# Patient Record
Sex: Male | Born: 1975 | Race: Black or African American | Hispanic: No | Marital: Single | Smoking: Current every day smoker
Health system: Southern US, Community
[De-identification: ages and names within clinical notes are randomized; demographics above are authoritative.]

---

## 2004-05-27 ENCOUNTER — Emergency Department: Payer: Self-pay | Admitting: Emergency Medicine

## 2004-05-28 ENCOUNTER — Other Ambulatory Visit: Payer: Self-pay

## 2012-12-27 ENCOUNTER — Emergency Department (HOSPITAL_COMMUNITY)
Admission: EM | Admit: 2012-12-27 | Discharge: 2012-12-27 | Disposition: A | Discharge status: Home / Self Care | Payer: Self-pay | Attending: Emergency Medicine | Admitting: Emergency Medicine

## 2012-12-27 ENCOUNTER — Encounter (HOSPITAL_COMMUNITY): Payer: Self-pay

## 2012-12-27 DIAGNOSIS — R300 Dysuria: Secondary | ICD-10-CM | POA: Insufficient documentation

## 2012-12-27 DIAGNOSIS — F17200 Nicotine dependence, unspecified, uncomplicated: Secondary | ICD-10-CM | POA: Insufficient documentation

## 2012-12-27 DIAGNOSIS — R109 Unspecified abdominal pain: Secondary | ICD-10-CM | POA: Insufficient documentation

## 2012-12-27 DIAGNOSIS — N342 Other urethritis: Secondary | ICD-10-CM | POA: Insufficient documentation

## 2012-12-27 MED ORDER — ONDANSETRON 8 MG PO TBDP
8.0000 mg | ORAL_TABLET | Freq: Once | ORAL | Status: AC
  Administered 2012-12-27: 8 mg via ORAL
  Filled 2012-12-27: qty 1

## 2012-12-27 MED ORDER — LIDOCAINE HCL (PF) 1 % IJ SOLN
INTRAMUSCULAR | Status: AC
  Administered 2012-12-27: 1 mL
  Filled 2012-12-27: qty 5

## 2012-12-27 MED ORDER — AZITHROMYCIN 250 MG PO TABS
1000.0000 mg | ORAL_TABLET | Freq: Every day | ORAL | Status: DC
  Administered 2012-12-27: 1000 mg via ORAL
  Filled 2012-12-27: qty 4

## 2012-12-27 MED ORDER — CEFTRIAXONE SODIUM 250 MG IJ SOLR
250.0000 mg | Freq: Once | INTRAMUSCULAR | Status: AC
  Administered 2012-12-27: 250 mg via INTRAMUSCULAR
  Filled 2012-12-27: qty 250

## 2012-12-27 NOTE — ED Notes (Signed)
Pt reprots yellow penile discharge since Wednesday had unprotected sex on on Monday, denies any other urinary s/s,no fever, no n/v/

## 2012-12-27 NOTE — ED Provider Notes (Signed)
History  This chart was scribed for William Gaskins, MD by Shari Heritage, ED Scribe. The patient was seen in room APA08/APA08. Patient's care was started at 0920.   CSN: [id number]  Arrival date & time 12/27/12  0907   First MD Initiated Contact with Patient 12/27/12 0920      Chief Complaint  Patient presents with  . Penile Discharge    The history is provided by the patient. No language interpreter was used.    HPI Comments: Bronislaw Switzer is a 37 y.o. male who presents to the Emergency Department complaining of persistent, yellow penile discharge onset 3 days ago. There is associated mild dysuria and mild lower abdominal pain. Patient denies fever, chills, nausea or vomiting. He admits to having had unprotected sex 5 days ago. Patient has no chronic medical conditions and no pertinent past surgical history. He is a current every day smoker.   PMH - none  History reviewed. No pertinent past surgical history.  No family history on file.  History  Substance Use Topics  . Smoking status: Current Every Day Smoker  . Smokeless tobacco: Not on file  . Alcohol Use: No      Review of Systems  Constitutional: Negative for fever and chills.  Gastrointestinal: Positive for abdominal pain. Negative for nausea and vomiting.  Genitourinary: Positive for dysuria and discharge.    Allergies  Review of patient's allergies indicates no known allergies.  Home Medications  No current outpatient prescriptions on file.  Triage Vitals: BP 178/94  Pulse 63  Temp(Src) 98.5 F (36.9 C) (Oral)  Resp 20  Ht 5\' 10"  (1.778 m)  Wt 230 lb (104.327 kg)  BMI 33 kg/m2  SpO2 100%  Physical Exam CONSTITUTIONAL: Well developed/well nourished HEAD: Normocephalic/atraumatic EYES: EOMI ENMT: Mucous membranes moist NECK: supple no meningeal signs CV: S1/S2 noted, no murmurs/rubs/gallops noted LUNGS: Lungs are clear to auscultation bilaterally, no apparent distress ABDOMEN: soft, nontender,  no rebound or guarding GU:no cva tenderness, penile discharge noted, no penile lesions, no testicular tenderness (chaperone present) NEURO: Pt is awake/alert, moves all extremitiesx4 EXTREMITIES: pulses normal, full ROM SKIN: warm, color normal PSYCH: no abnormalities of mood noted  ED Course  Procedures (including critical care time) DIAGNOSTIC STUDIES: Oxygen Saturation is 100% on room air, normal by my interpretation.    COORDINATION OF CARE: 9:41 AM- Patient informed of current plan for treatment and evaluation and agrees with plan at this time.   Advised of safe sex practices Advised to get BP check soon to ensure no diagnosis of HTN Empirically tx for GC/Chlam   Labs Reviewed  GC/CHLAMYDIA PROBE AMP    1. Urethritis       MDM  Nursing notes including past medical history and social history reviewed and considered in documentation       I personally performed the services described in this documentation, which was scribed in my presence. The recorded information has been reviewed and is accurate.      William Gaskins, MD 12/27/12 1000

## 2012-12-31 NOTE — ED Notes (Signed)
+  Gonorrhea Patient treated with Rocephin-[facility] letter faxed

## 2013-01-01 ENCOUNTER — Telehealth (HOSPITAL_COMMUNITY): Payer: Self-pay | Admitting: Emergency Medicine

## 2013-01-02 ENCOUNTER — Telehealth (HOSPITAL_COMMUNITY): Payer: Self-pay | Admitting: Emergency Medicine

## 2015-03-05 ENCOUNTER — Encounter (HOSPITAL_COMMUNITY): Payer: Self-pay | Admitting: Emergency Medicine

## 2015-03-05 ENCOUNTER — Emergency Department (HOSPITAL_COMMUNITY)
Admission: EM | Admit: 2015-03-05 | Discharge: 2015-03-05 | Discharge status: Against Medical Advice | Payer: Self-pay | Attending: Emergency Medicine | Admitting: Emergency Medicine

## 2015-03-05 DIAGNOSIS — F1010 Alcohol abuse, uncomplicated: Secondary | ICD-10-CM | POA: Insufficient documentation

## 2015-03-05 DIAGNOSIS — F1410 Cocaine abuse, uncomplicated: Secondary | ICD-10-CM | POA: Insufficient documentation

## 2015-03-05 DIAGNOSIS — F1910 Other psychoactive substance abuse, uncomplicated: Secondary | ICD-10-CM

## 2015-03-05 DIAGNOSIS — Z720 Tobacco use: Secondary | ICD-10-CM | POA: Insufficient documentation

## 2015-03-05 NOTE — ED Provider Notes (Signed)
Pt not in room when I went to see him.  Pt left ama  Bethann Berkshire, MD 03/05/15 1134

## 2015-03-05 NOTE — ED Notes (Signed)
Patient came in with initial c/o nausea and vomiting. Per patient in triage really here in ER today for help with alcohol and cocaine. Per patient "partied last night" with use of cocaine. Denies any other illegal substances. Patient denies any chest pain or shortness of breath. Denies any SI/HI.

## 2015-09-18 ENCOUNTER — Emergency Department (HOSPITAL_COMMUNITY)
Admission: EM | Admit: 2015-09-18 | Discharge: 2015-09-18 | Disposition: A | Discharge status: Home / Self Care | Payer: No Typology Code available for payment source | Attending: Emergency Medicine | Admitting: Emergency Medicine

## 2015-09-18 ENCOUNTER — Emergency Department (HOSPITAL_COMMUNITY): Payer: No Typology Code available for payment source

## 2015-09-18 ENCOUNTER — Encounter (HOSPITAL_COMMUNITY): Payer: Self-pay | Admitting: *Deleted

## 2015-09-18 DIAGNOSIS — Y998 Other external cause status: Secondary | ICD-10-CM | POA: Diagnosis not present

## 2015-09-18 DIAGNOSIS — F17210 Nicotine dependence, cigarettes, uncomplicated: Secondary | ICD-10-CM | POA: Insufficient documentation

## 2015-09-18 DIAGNOSIS — Y92410 Unspecified street and highway as the place of occurrence of the external cause: Secondary | ICD-10-CM | POA: Insufficient documentation

## 2015-09-18 DIAGNOSIS — S3992XA Unspecified injury of lower back, initial encounter: Secondary | ICD-10-CM | POA: Diagnosis not present

## 2015-09-18 DIAGNOSIS — S0990XA Unspecified injury of head, initial encounter: Secondary | ICD-10-CM | POA: Diagnosis present

## 2015-09-18 DIAGNOSIS — F1010 Alcohol abuse, uncomplicated: Secondary | ICD-10-CM | POA: Diagnosis not present

## 2015-09-18 DIAGNOSIS — S301XXA Contusion of abdominal wall, initial encounter: Secondary | ICD-10-CM | POA: Insufficient documentation

## 2015-09-18 DIAGNOSIS — S060X9A Concussion with loss of consciousness of unspecified duration, initial encounter: Secondary | ICD-10-CM | POA: Diagnosis not present

## 2015-09-18 DIAGNOSIS — R030 Elevated blood-pressure reading, without diagnosis of hypertension: Secondary | ICD-10-CM | POA: Insufficient documentation

## 2015-09-18 DIAGNOSIS — Y9389 Activity, other specified: Secondary | ICD-10-CM | POA: Insufficient documentation

## 2015-09-18 MED ORDER — ACETAMINOPHEN 325 MG PO TABS
650.0000 mg | ORAL_TABLET | Freq: Once | ORAL | Status: AC
  Administered 2015-09-18: 650 mg via ORAL
  Filled 2015-09-18: qty 2

## 2015-09-18 MED ORDER — ACETAMINOPHEN 325 MG PO TABS: 650.0000 mg | ORAL_TABLET | Freq: Once | ORAL | Status: DC
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
## 2018-05-07 ENCOUNTER — Emergency Department (HOSPITAL_COMMUNITY)
Admission: EM | Admit: 2018-05-07 | Discharge: 2018-05-07 | Disposition: A | Discharge status: Home / Self Care | Payer: Self-pay | Attending: Emergency Medicine | Admitting: Emergency Medicine

## 2018-05-07 ENCOUNTER — Encounter (HOSPITAL_COMMUNITY): Payer: Self-pay | Admitting: Emergency Medicine

## 2018-05-07 ENCOUNTER — Other Ambulatory Visit: Payer: Self-pay

## 2018-05-07 DIAGNOSIS — Z872 Personal history of diseases of the skin and subcutaneous tissue: Secondary | ICD-10-CM | POA: Insufficient documentation

## 2018-05-07 DIAGNOSIS — F17210 Nicotine dependence, cigarettes, uncomplicated: Secondary | ICD-10-CM | POA: Insufficient documentation

## 2018-05-07 NOTE — ED Triage Notes (Signed)
Pt reports allergic reaction onset approximately 230 this am. Pt reports history of same. Pt reports "its all gone now, except my head still feels funny." pt denies any new self care products,meds, etc. nad noted. Airway patent.

## 2018-05-07 NOTE — ED Provider Notes (Signed)
[facility] EMERGENCY DEPARTMENT Provider Note   CSN: [id number] Arrival date & time: 05/07/18  1537     History   Chief Complaint Chief Complaint  Patient presents with  . Allergic Reaction    HPI William Navarro is a 42 y.o. male.  Patient is a 42 year old male who presents to the emergency department with a complaint of an allergic reaction.  The patient states that approximately 2:00 or 230 this morning he broke out in a rash that he describes as hives.  He says it was mostly on his arms, around his neck, and in the scalp.  The ones in his scalp in particular were very itchy.  The patient states that this went on until approximately 1 PM this evening.  The patient states that he has not had any new over-the-counter medications, no vitamins or herbs.  He has not been around any noxious fumes.  He has not had any new immunizations, he has not been out of the country recently.  No insect bites that he is aware of.  The patient states that he has not been eating meat for over a month, and recently for the last night he had a steak.  He says other than that he has not had any changes in his diet or any other problem.  The patient states this is about the third time that he has had this kind of reaction, mostly happens at night.  He has not had an allergy evaluation.  He says he had some difficulty with his swallowing, but no actual shortness of breath, no cough.  The patient took some Benadryl on last night.  He says that his symptoms have been getting gradually better.  He presents now for evaluation of this issue.  The history is provided by the patient.  Allergic Reaction  Presenting symptoms: difficulty swallowing and rash   Presenting symptoms: no wheezing     History reviewed. No pertinent past medical history.  There are no active problems to display for this patient.   History reviewed. No pertinent surgical history.      Home Medications    Prior to Admission  medications   Not on File    Family History History reviewed. No pertinent family history.  Social History Social History   Tobacco Use  . Smoking status: Current Every Day Smoker    Packs/day: 0.05    Years: 4.00    Pack years: 0.20    Types: Cigarettes  . Smokeless tobacco: Never Used  Substance Use Topics  . Alcohol use: Yes    Comment: typically on weekends  . Drug use: Yes    Types: Cocaine     Allergies   Patient has no known allergies.   Review of Systems Review of Systems  Constitutional: Negative for activity change.       All ROS Neg except as noted in HPI  HENT: Positive for trouble swallowing. Negative for nosebleeds.   Eyes: Negative for photophobia and discharge.  Respiratory: Negative for cough, shortness of breath and wheezing.   Cardiovascular: Negative for chest pain and palpitations.  Gastrointestinal: Negative for abdominal pain and blood in stool.  Genitourinary: Negative for dysuria, frequency and hematuria.  Musculoskeletal: Negative for arthralgias, back pain and neck pain.  Skin: Positive for rash.  Neurological: Negative for dizziness, seizures and speech difficulty.  Psychiatric/Behavioral: Negative for confusion and hallucinations.     Physical Exam Updated Vital Signs BP 139/73 (BP Location: Right Arm)  Pulse (!) 103   Temp 99.3 F (37.4 C) (Oral)   Resp 20   Ht 5\' 10"  (1.778 m)   Wt 99.8 kg   SpO2 100%   BMI 31.57 kg/m   Physical Exam  Constitutional: He appears well-developed and well-nourished. No distress.  HENT:  Head: Normocephalic and atraumatic.  Right Ear: External ear normal.  Left Ear: External ear normal.  Minimal increased redness of the posterior pharynx.  Eyes: Conjunctivae are normal. Right eye exhibits no discharge. Left eye exhibits no discharge. No scleral icterus.  Neck: Neck supple. No tracheal deviation present.  Cardiovascular: Normal rate, regular rhythm and intact distal pulses.    Pulmonary/Chest: Effort normal and breath sounds normal. No stridor. No respiratory distress. He has no wheezes. He has no rales.  Abdominal: Soft. Bowel sounds are normal. He exhibits no distension. There is no tenderness. There is no rebound and no guarding.  Musculoskeletal: He exhibits no edema or tenderness.  Neurological: He is alert. He has normal strength. No cranial nerve deficit (no facial droop, extraocular movements intact, no slurred speech) or sensory deficit. He exhibits normal muscle tone. He displays no seizure activity. Coordination normal.  Skin: Skin is warm and dry. No rash noted.  Psychiatric: He has a normal mood and affect.  Nursing note and vitals reviewed.    ED Treatments / Results  Labs (all labs ordered are listed, but only abnormal results are displayed) Labs Reviewed - No data to display  EKG None  Radiology No results found.  Procedures Procedures (including critical care time)  Medications Ordered in ED Medications - No data to display   Initial Impression / Assessment and Plan / ED Course  I have reviewed the triage vital signs and the nursing notes.  Pertinent labs & imaging results that were available during my care of the patient were reviewed by me and considered in my medical decision making (see chart for details).      Final Clinical Impressions(s) / ED Diagnoses MDM  Vital signs reviewed.  Pulse oximetry is 100% on room air.  Within normal limits by my interpretation.  The patient states that about 2 in the morning he had a situation with what he describes as hives and itching.  He also reports some difficulty for some time with his swallowing.  At this time the patient is speaking in complete sentences he is in no distress.  There are no rashes or hives noted.   I advised the patient to keep his Benadryl close by.  I have asked him to schedule an appointment with the allergist here in town.  And have asked him to return to the  emergency department immediately if any changes in his condition, problems, or concerns.  Patient is in agreement with this plan.    Final diagnoses:  History of urticaria    ED Discharge Orders    None       Ivery Quale, PA-C 05/07/18 1741    Samuel Jester, DO 05/09/18 2154

## 2018-05-07 NOTE — Discharge Instructions (Addendum)
Your temperature is 99.3, and your heart rate is slightly elevated.  There is mild increased redness in the back of your throat.  This episode of rash/hives may be related to an oncoming viral infection.  However given that you have had this on 2 or 3 previous occasions, and this time he felt as though that there may have been some difficulty with swallowing.  It is strongly advised that you see the Dr. Willa Rough, or the allergy specialist of your choice as soon as possible for formal evaluation.  Please keep your Benadryl close by.  Please return to the emergency department immediately if any changes in your condition, problems, or concerns.

## 2018-05-31 ENCOUNTER — Emergency Department (HOSPITAL_COMMUNITY)
Admission: EM | Admit: 2018-05-31 | Discharge: 2018-06-01 | Disposition: A | Discharge status: Home / Self Care | Payer: Self-pay | Attending: Emergency Medicine | Admitting: Emergency Medicine

## 2018-05-31 ENCOUNTER — Emergency Department (HOSPITAL_COMMUNITY): Payer: Self-pay

## 2018-05-31 ENCOUNTER — Other Ambulatory Visit: Payer: Self-pay

## 2018-05-31 ENCOUNTER — Encounter (HOSPITAL_COMMUNITY): Payer: Self-pay

## 2018-05-31 DIAGNOSIS — F17210 Nicotine dependence, cigarettes, uncomplicated: Secondary | ICD-10-CM | POA: Insufficient documentation

## 2018-05-31 DIAGNOSIS — R55 Syncope and collapse: Secondary | ICD-10-CM | POA: Insufficient documentation

## 2018-05-31 DIAGNOSIS — D751 Secondary polycythemia: Secondary | ICD-10-CM | POA: Insufficient documentation

## 2018-05-31 LAB — CBC WITH DIFFERENTIAL/PLATELET
Abs Immature Granulocytes: 0.02 10*3/uL (ref 0.00–0.07)
Basophils Absolute: 0.1 10*3/uL (ref 0.0–0.1)
Basophils Relative: 1 %
Eosinophils Absolute: 0.1 10*3/uL (ref 0.0–0.5)
Eosinophils Relative: 1 %
HCT: 57.2 % — ABNORMAL HIGH (ref 39.0–52.0)
HEMOGLOBIN: 20 g/dL — AB (ref 13.0–17.0)
Immature Granulocytes: 0 %
LYMPHS PCT: 9 %
Lymphs Abs: 0.9 10*3/uL (ref 0.7–4.0)
MCH: 30.3 pg (ref 26.0–34.0)
MCHC: 35 g/dL (ref 30.0–36.0)
MCV: 86.8 fL (ref 80.0–100.0)
MONO ABS: 0.3 10*3/uL (ref 0.1–1.0)
MONOS PCT: 3 %
Neutro Abs: 8.1 10*3/uL — ABNORMAL HIGH (ref 1.7–7.7)
Neutrophils Relative %: 86 %
Platelets: 244 10*3/uL (ref 150–400)
RBC: 6.59 MIL/uL — ABNORMAL HIGH (ref 4.22–5.81)
RDW: 13.5 % (ref 11.5–15.5)
WBC: 9.4 10*3/uL (ref 4.0–10.5)
nRBC: 0 % (ref 0.0–0.2)

## 2018-05-31 LAB — TROPONIN I
TROPONIN I: 0.05 ng/mL — AB (ref <0.03)
Troponin I: 0.05 ng/mL (ref <0.03)

## 2018-05-31 LAB — BASIC METABOLIC PANEL
Anion gap: 13 (ref 5–15)
BUN: 18 mg/dL (ref 6–20)
CHLORIDE: 105 mmol/L (ref 98–111)
CO2: 20 mmol/L — ABNORMAL LOW (ref 22–32)
Calcium: 9.6 mg/dL (ref 8.9–10.3)
Creatinine, Ser: 1.25 mg/dL — ABNORMAL HIGH (ref 0.61–1.24)
GFR calc Af Amer: >60 mL/min (ref >60)
GFR calc non Af Amer: >60 mL/min (ref >60)
GLUCOSE: 105 mg/dL — AB (ref 70–99)
Potassium: 3.2 mmol/L — ABNORMAL LOW (ref 3.5–5.1)
Sodium: 138 mmol/L (ref 135–145)

## 2018-05-31 MED ORDER — SODIUM CHLORIDE 0.9 % IV BOLUS
1000.0000 mL | Freq: Once | INTRAVENOUS | Status: AC
  Administered 2018-05-31: 1000 mL via INTRAVENOUS

## 2018-05-31 MED ORDER — DIPHENHYDRAMINE HCL 25 MG PO CAPS
25.0000 mg | ORAL_CAPSULE | Freq: Once | ORAL | Status: AC
  Administered 2018-05-31: 25 mg via ORAL
  Filled 2018-05-31: qty 1

## 2018-05-31 MED ORDER — METHYLPREDNISOLONE SODIUM SUCC 125 MG IJ SOLR
125.0000 mg | Freq: Once | INTRAMUSCULAR | Status: AC
  Administered 2018-05-31: 125 mg via INTRAVENOUS
  Filled 2018-05-31: qty 2

## 2018-05-31 NOTE — ED Triage Notes (Addendum)
Pt reports eating red meat earlier and getting hives with a headache.  Pt reports prior to arriving "getting fast heartbeat" " getting up to get water and passing out". Pt says he "remembers passing out and waking up". Pt says he feels better now. Pt denies pain. Pt is anxious and reports nausea. EMS says pt reported wanting to go home in route.

## 2018-05-31 NOTE — ED Notes (Signed)
Pt reports walking to kitchen and passed out. Pt denies hitting head or any injuries. Pt states he remembers passing out and remembers waking up.

## 2018-05-31 NOTE — ED Notes (Signed)
ED Provider at bedside. 

## 2018-05-31 NOTE — ED Provider Notes (Signed)
[facility] EMERGENCY DEPARTMENT Provider Note   CSN: [id number] Arrival date & time: 05/31/18  2037     History   Chief Complaint Chief Complaint  Patient presents with  . Near Syncope    HPI William Navarro is a 42 y.o. male.  HPI Patient presents after an episode of syncope.  States that he thinks he has an allergy to red meat now.  States he ate some red meat this morning and later today developed hives.  States it was hives over his body.  States that his tongue felt a little swollen.  States he felt his heart rate go fast he stood up and walking in the kitchen and passed out.  States hives improved somewhat.  Denies chest pain.  He was seen a couple weeks ago for thought of an allergy at that time.  Does have a history of cocaine abuse states that he last used a few days ago.  States he feels better now and really did not want to come into the hospital.  Patient states that his face still feels swollen. History reviewed. No pertinent past medical history.  There are no active problems to display for this patient.   History reviewed. No pertinent surgical history.      Home Medications    Prior to Admission medications   Not on File    Family History No family history on file.  Social History Social History   Tobacco Use  . Smoking status: Current Every Day Smoker    Packs/day: 0.05    Years: 4.00    Pack years: 0.20    Types: Cigarettes  . Smokeless tobacco: Never Used  Substance Use Topics  . Alcohol use: Yes    Comment: typically on weekends- reports a few beers tonight  . Drug use: Yes    Types: Cocaine    Comment: last used couple days ago     Allergies   Patient has no known allergies.   Review of Systems Review of Systems  Constitutional: Negative for appetite change.  HENT: Negative for congestion.   Respiratory: Negative for shortness of breath.   Gastrointestinal: Negative for abdominal pain.  Genitourinary: Negative for flank  pain.  Musculoskeletal: Negative for back pain.  Skin: Positive for rash.  Neurological: Positive for syncope.  Psychiatric/Behavioral: Negative for confusion.     Physical Exam Updated Vital Signs BP 134/75   Pulse 85   Resp 17   Ht 5\' 10"  (1.778 m)   Wt 99.8 kg   SpO2 98%   BMI 31.57 kg/m   Physical Exam  Constitutional: He appears well-developed.  HENT:  Head: Normocephalic.  Eyes: EOM are normal.  Neck: Neck supple.  Cardiovascular:  Mild tachycardia  Pulmonary/Chest: Effort normal.  Abdominal: There is no tenderness.  Musculoskeletal: He exhibits no edema.  Neurological: He is alert.  Skin: Skin is warm. Capillary refill takes less than 2 seconds.     ED Treatments / Results  Labs (all labs ordered are listed, but only abnormal results are displayed) Labs Reviewed  CBC WITH DIFFERENTIAL/PLATELET - Abnormal; Notable for the following components:      Result Value   RBC 6.59 (*)    Hemoglobin 20.0 (*)    HCT 57.2 (*)    Neutro Abs 8.1 (*)    All other components within normal limits  BASIC METABOLIC PANEL - Abnormal; Notable for the following components:   Potassium 3.2 (*)    CO2 20 (*)  Glucose, Bld 105 (*)    Creatinine, Ser 1.25 (*)    All other components within normal limits  TROPONIN I - Abnormal; Notable for the following components:   Troponin I 0.05 (*)    All other components within normal limits  TROPONIN I    EKG EKG Interpretation  Date/Time:  Saturday May 31 2018 20:51:55 EDT Ventricular Rate:  121 PR Interval:    QRS Duration: 88 QT Interval:  311 QTC Calculation: 442 R Axis:   59 Text Interpretation:  Sinus tachycardia LAE, consider biatrial enlargement Confirmed by Benjiman Core [phone]) on 05/31/2018 9:24:49 PM   Radiology No results found.  Procedures Procedures (including critical care time)  Medications Ordered in ED Medications  sodium chloride 0.9 % bolus 1,000 mL (0 mLs Intravenous Stopped 05/31/18  2253)  methylPREDNISolone sodium succinate (SOLU-MEDROL) 125 mg/2 mL injection 125 mg (125 mg Intravenous Given 05/31/18 2150)  diphenhydrAMINE (BENADRYL) capsule 25 mg (25 mg Oral Given 05/31/18 2149)     Initial Impression / Assessment and Plan / ED Course  I have reviewed the triage vital signs and the nursing notes.  Pertinent labs & imaging results that were available during my care of the patient were reviewed by me and considered in my medical decision making (see chart for details).    Patient with a syncopal episode.  Patient thinks it is related to allergy which could be.  States he had hives and some tongue swelling before it.  These have since resolved.  May have some mild facial swelling.  Initial tachycardia has resolved.  However he does have a elevated hemoglobin.  States he does not know if it is been checked previously.  He is a cigarette smoker.  Also some mild renal insufficiency.  Troponin is minimally above normal at 0.05.  EKG reassuring.  Reports somewhat recent cocaine use a couple days ago.  No chest pain.  Patient feels much better.  I think it is worth a recheck on the troponin and if it is negative can likely be worked up more as an outpatient.  He has been monitored here with no arrhythmia besides the tachycardia.  Care will be turned over to Dr. Lynelle Doctor.  Final Clinical Impressions(s) / ED Diagnoses   Final diagnoses:  Syncope, unspecified syncope type  Polycythemia    ED Discharge Orders    None       Benjiman Core, MD 05/31/18 2325

## 2018-05-31 NOTE — ED Notes (Signed)
Pt given nonslip socks- pt ambulatory to bathroom with standby assist- pt has steady gait and denies dizziness or weakness.

## 2018-05-31 NOTE — ED Notes (Signed)
Date and time results received: 05/31/18 2234   Test: Troponin Critical Value: 0.05  Name of Provider Notified: Rubin Payor, MD

## 2018-05-31 NOTE — Discharge Instructions (Addendum)
Your hemoglobin is 20.  Your creatinine is also mildly elevated.  You need to follow-up with a primary care physician.  You can try the rocking him [facility] department and the free clinic in [place].  Try to drink more fluids like sports drinks to keep you hydrated.

## 2018-05-31 NOTE — ED Notes (Signed)
During initial assessment with Pt, Pt ignored RN and spoke on phone the entire time.

## 2018-06-01 NOTE — ED Provider Notes (Signed)
Patient left a change of shift to finish his delta troponin.  Patient does admit to cocaine use several days ago.  He had some allergic type symptoms tonight with itching and feeling his tongue was swelling with nausea and vomiting followed by a syncopal episode.  Results for orders placed or performed during the hospital encounter of 05/31/18  CBC with Differential  Troponin I  Result Value Ref Range   Troponin I 0.05 (HH) <0.03 ng/mL  Troponin I  Result Value Ref Range   Troponin I 0.05 (HH) <0.03 ng/mL   12:05 PM I discussed patient's delta troponin which was negative for acute myocardial ischemia.  Patient states he feels much better.  He does admit that he has not been drinking fluids recently.  We discussed drinking Gatorade on the way home and tomorrow the next couple days.  Diagnoses that have been ruled out:  None  Diagnoses that are still under consideration:  None  Final diagnoses:  Syncope, unspecified syncope type  Polycythemia    Plan discharge  Devoria Albe, MD, Concha Pyo, MD 06/01/18 [phone]

## 2020-02-06 IMAGING — DX DG CHEST 2V
2 series · 2 of 2 positions shown · non-contrast
Comparison: None.

CLINICAL DATA: Syncope

EXAM:
CHEST - 2 VIEW

[chest pa]
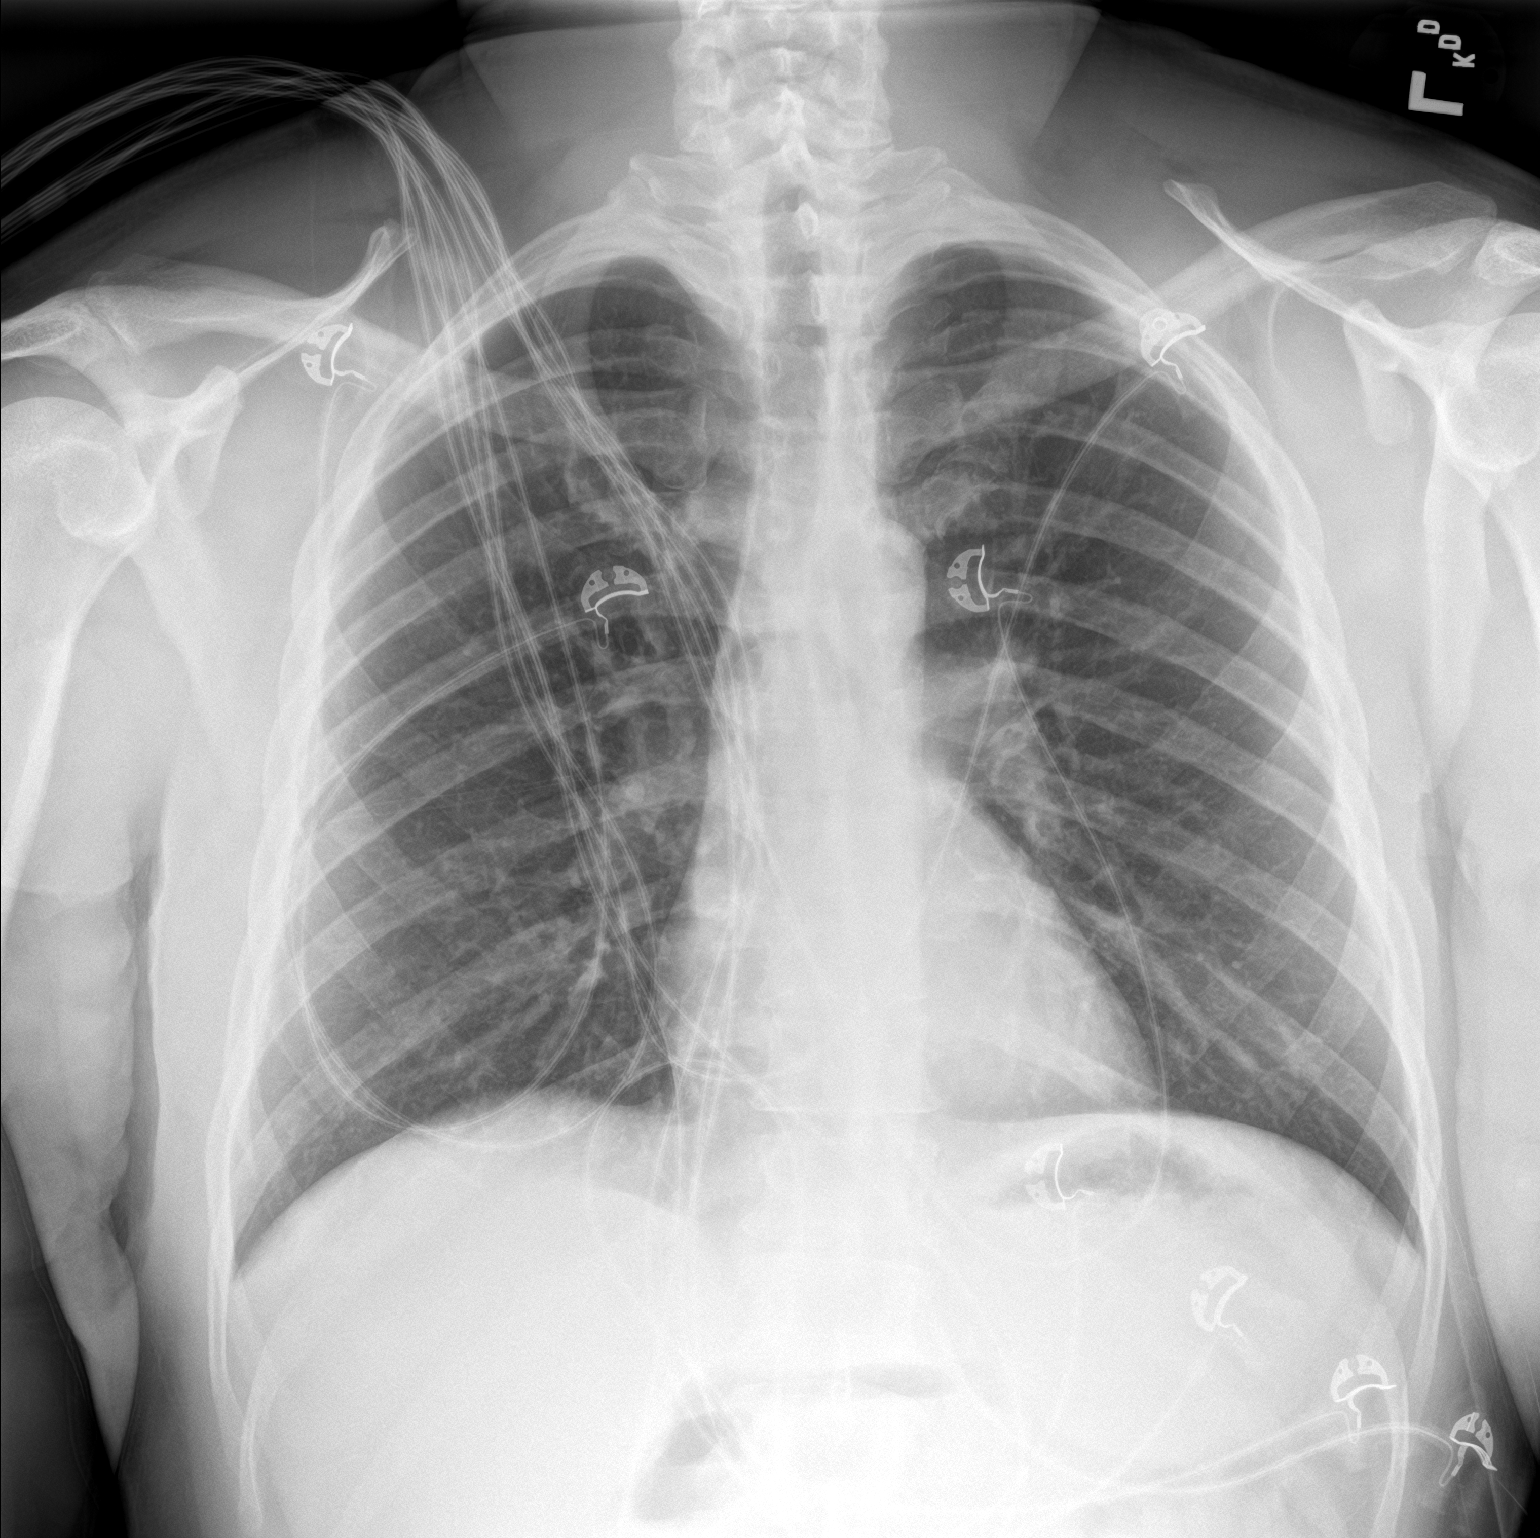

[chest lat]
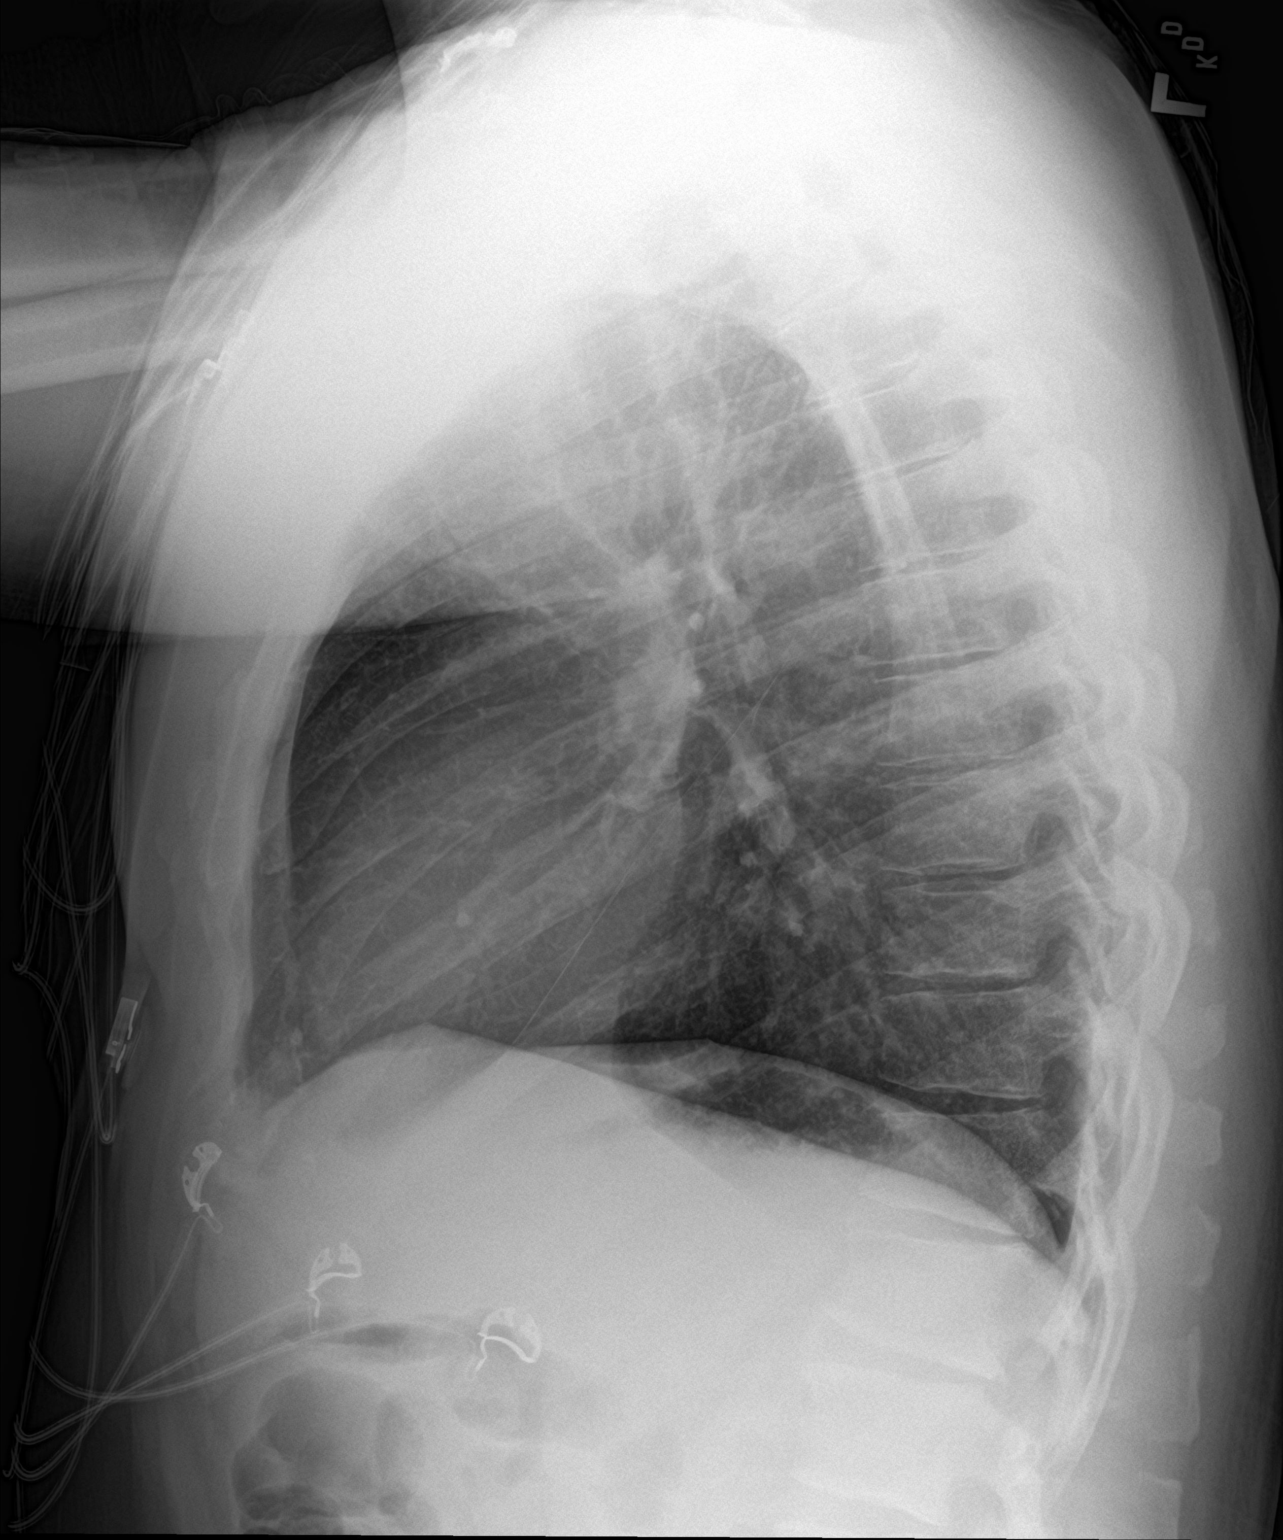

[2 of 2 positions shown; findings below may reference images not displayed]

FINDINGS: Normal heart size. Normal mediastinal contour. No pneumothorax. No
pleural effusion. Lungs appear clear, with no acute consolidative
airspace disease and no pulmonary edema.
IMPRESSION: No active cardiopulmonary disease.

## 2020-06-06 ENCOUNTER — Other Ambulatory Visit: Payer: Self-pay

## 2020-06-06 ENCOUNTER — Emergency Department (HOSPITAL_COMMUNITY)
Admission: EM | Admit: 2020-06-06 | Discharge: 2020-06-06 | Disposition: A | Discharge status: Home / Self Care | Payer: Self-pay | Attending: Emergency Medicine | Admitting: Emergency Medicine

## 2020-06-06 ENCOUNTER — Encounter (HOSPITAL_COMMUNITY): Payer: Self-pay | Admitting: Emergency Medicine

## 2020-06-06 DIAGNOSIS — K047 Periapical abscess without sinus: Secondary | ICD-10-CM | POA: Insufficient documentation

## 2020-06-06 DIAGNOSIS — F17210 Nicotine dependence, cigarettes, uncomplicated: Secondary | ICD-10-CM | POA: Insufficient documentation

## 2020-06-06 MED ORDER — CLINDAMYCIN HCL 150 MG PO CAPS: 300.0000 mg | ORAL_CAPSULE | Freq: Three times a day (TID) | ORAL | 0 refills | Status: AC

## 2020-06-06 MED ORDER — IBUPROFEN 600 MG PO TABS: 600.0000 mg | ORAL_TABLET | Freq: Four times a day (QID) | ORAL | 0 refills | Status: AC | PRN

## 2020-06-06 NOTE — ED Triage Notes (Signed)
Pt c/o of dental pain with right sided facial swelling since saturday

## 2020-06-06 NOTE — Discharge Instructions (Signed)
Take clindamycin 3 times a day for the next 10 days, ibuprofen as needed every 8 hours, seek medical exam for severe or worsening pain swelling fever or difficulty swallowing breathing or talking.  You will need to see a dentist in 10 days for a follow-up and to have your tooth extracted or repaired.

## 2020-06-06 NOTE — ED Provider Notes (Signed)
[facility] EMERGENCY DEPARTMENT Provider Note   CSN: [id number] Arrival date & time: 06/06/20  0745     History Chief Complaint  Patient presents with  . Dental Pain    William Navarro is a 44 y.o. male.  HPI   This patient is a 44 year old male, he has had some increasing dental pain and swelling of his right face for the last 2 days.  He was supposed to have a dental extraction done last week but missed the appointment and subsequently developed some increasing swelling.  He denies fevers or chills, denies difficulty with swallowing speaking or talking or breathing.  He has no pain around his eye.  He does state that he has some facial swelling.  He has not had any antibiotics but was advised to come to the hospital for an antibiotic.  Symptoms are persistent and gradually worsening  History reviewed. No pertinent past medical history.  There are no problems to display for this patient.   History reviewed. No pertinent surgical history.     History reviewed. No pertinent family history.  Social History   Tobacco Use  . Smoking status: Current Every Day Smoker    Packs/day: 0.05    Years: 4.00    Pack years: 0.20    Types: Cigarettes  . Smokeless tobacco: Never Used  Substance Use Topics  . Alcohol use: Yes    Comment: typically on weekends- reports a few beers tonight  . Drug use: Yes    Types: Cocaine    Comment: last used couple days ago    Home Medications Prior to Admission medications   Medication Sig Start Date End Date Taking? Authorizing Provider  clindamycin (CLEOCIN) 150 MG capsule Take 2 capsules (300 mg total) by mouth 3 (three) times daily. May dispense as 150mg  capsules 06/06/20   06/08/20, MD  ibuprofen (ADVIL) 600 MG tablet Take 1 tablet (600 mg total) by mouth every 6 (six) hours as needed. 06/06/20   06/08/20, MD    Allergies    Patient has no known allergies.  Review of Systems   Review of Systems  Constitutional: Negative  for fever.  HENT: Positive for dental problem and facial swelling.     Physical Exam Updated Vital Signs Temp 98.7 F (37.1 C) (Oral)   Ht 1.778 m (5\' 10" )   Wt 95.3 kg   BMI 30.13 kg/m   Physical Exam Vitals and nursing note reviewed.  Constitutional:      Appearance: He is well-developed. He is not diaphoretic.  HENT:     Head: Normocephalic and atraumatic.     Mouth/Throat:     Comments: Oropharynx is clear and moist, there is swelling of the right upper gingiva abutting one of the right upper premolars.  There does appear to be a fracture of that area.  He is able to fully open his mouth without trismus or torticollis, no lymphadenopathy of the neck Eyes:     General:        Right eye: No discharge.        Left eye: No discharge.     Conjunctiva/sclera: Conjunctivae normal.  Cardiovascular:     Rate and Rhythm: Normal rate.  Pulmonary:     Effort: Pulmonary effort is normal. No respiratory distress.  Lymphadenopathy:     Cervical: No cervical adenopathy.  Skin:    General: Skin is warm and dry.     Findings: No erythema or rash.  Neurological:  Mental Status: He is alert.     Coordination: Coordination normal.     ED Results / Procedures / Treatments   Labs (all labs ordered are listed, but only abnormal results are displayed) Labs Reviewed - No data to display  EKG None  Radiology No results found.  Procedures Procedures (including critical care time)  Medications Ordered in ED Medications - No data to display  ED Course  I have reviewed the triage vital signs and the nursing notes.  Pertinent labs & imaging results that were available during my care of the patient were reviewed by me and considered in my medical decision making (see chart for details).    MDM Rules/Calculators/A&P                          Well-appearing, obvious dental abscess, no signs of Ludwick's angina, note periorbital involvement.  Clindamycin prescribed, patient will  follow up with dentist, agreeable to the plan  Final Clinical Impression(s) / ED Diagnoses Final diagnoses:  Dental abscess    Rx / DC Orders ED Discharge Orders         Ordered    clindamycin (CLEOCIN) 150 MG capsule  3 times daily        06/06/20 0809    ibuprofen (ADVIL) 600 MG tablet  Every 6 hours PRN        06/06/20 0809           Eber Hong, MD 06/06/20 [phone]

## 2021-03-20 ENCOUNTER — Other Ambulatory Visit: Payer: Self-pay

## 2021-03-20 ENCOUNTER — Emergency Department (HOSPITAL_COMMUNITY)
Admission: EM | Admit: 2021-03-20 | Discharge: 2021-03-20 | Disposition: A | Discharge status: Home / Self Care | Payer: Self-pay | Attending: Emergency Medicine | Admitting: Emergency Medicine

## 2021-03-20 ENCOUNTER — Encounter (HOSPITAL_COMMUNITY): Payer: Self-pay | Admitting: *Deleted

## 2021-03-20 DIAGNOSIS — W458XXA Other foreign body or object entering through skin, initial encounter: Secondary | ICD-10-CM | POA: Insufficient documentation

## 2021-03-20 DIAGNOSIS — S6992XA Unspecified injury of left wrist, hand and finger(s), initial encounter: Secondary | ICD-10-CM

## 2021-03-20 DIAGNOSIS — S60453A Superficial foreign body of left middle finger, initial encounter: Secondary | ICD-10-CM | POA: Insufficient documentation

## 2021-03-20 DIAGNOSIS — Z23 Encounter for immunization: Secondary | ICD-10-CM | POA: Insufficient documentation

## 2021-03-20 DIAGNOSIS — F17210 Nicotine dependence, cigarettes, uncomplicated: Secondary | ICD-10-CM | POA: Insufficient documentation

## 2021-03-20 MED ORDER — CEPHALEXIN 500 MG PO CAPS: 500.0000 mg | ORAL_CAPSULE | Freq: Three times a day (TID) | ORAL | 0 refills | Status: AC

## 2021-03-20 MED ORDER — LIDOCAINE HCL (PF) 1 % IJ SOLN: 5.0000 mL | Freq: Once | INTRAMUSCULAR | Status: DC

## 2021-03-20 MED ORDER — TETANUS-DIPHTH-ACELL PERTUSSIS 5-2.5-18.5 LF-MCG/0.5 IM SUSY
0.5000 mL | PREFILLED_SYRINGE | Freq: Once | INTRAMUSCULAR | Status: AC
  Administered 2021-03-20: 0.5 mL via INTRAMUSCULAR
  Filled 2021-03-20: qty 0.5

## 2021-03-20 MED ORDER — LIDOCAINE HCL (PF) 1 % IJ SOLN
INTRAMUSCULAR | Status: AC
  Filled 2021-03-20: qty 5

## 2021-03-20 NOTE — ED Triage Notes (Signed)
Pt has fish hook in left middle finger

## 2021-03-20 NOTE — ED Provider Notes (Signed)
[facility] EMERGENCY DEPARTMENT Provider Note   CSN: [id number] Arrival date & time: 03/20/21  1719     History Chief Complaint  Patient presents with   Foreign Body in Skin    Left middle finger     William Navarro is a 45 y.o. male.  45 year old male with complaint of fishhook in the left third finger, attempted to remove prior to arrival in the ER unsuccessfully. Last td unknown. No other injuries. Patient is right hand dominant.       History reviewed. No pertinent past medical history.  There are no problems to display for this patient.   History reviewed. No pertinent surgical history.     History reviewed. No pertinent family history.  Social History   Tobacco Use   Smoking status: Every Day    Packs/day: 0.05    Years: 4.00    Pack years: 0.20    Types: Cigarettes   Smokeless tobacco: Never  Substance Use Topics   Alcohol use: Yes    Comment: typically on weekends- reports a few beers tonight   Drug use: Yes    Types: Cocaine    Comment: last used couple days ago    Home Medications Prior to Admission medications   Medication Sig Start Date End Date Taking? Authorizing Provider  cephALEXin (KEFLEX) 500 MG capsule Take 1 capsule (500 mg total) by mouth 3 (three) times daily for 7 days. 03/20/21 03/27/21 Yes Jeannie Fend, PA-C  clindamycin (CLEOCIN) 150 MG capsule Take 2 capsules (300 mg total) by mouth 3 (three) times daily. May dispense as 150mg  capsules 06/06/20   06/08/20, MD  ibuprofen (ADVIL) 600 MG tablet Take 1 tablet (600 mg total) by mouth every 6 (six) hours as needed. 06/06/20   06/08/20, MD    Allergies    Patient has no known allergies.  Review of Systems   Review of Systems  Constitutional:  Negative for fever.  Musculoskeletal:  Negative for arthralgias, joint swelling and myalgias.  Skin:  Positive for wound.  Allergic/Immunologic: Negative for immunocompromised state.  Neurological:  Negative for weakness and  numbness.  Hematological:  Does not bruise/bleed easily.   Physical Exam Updated Vital Signs Ht 5\' 10"  (1.778 m)   Wt 99.8 kg   BMI 31.57 kg/m   Physical Exam Vitals and nursing note reviewed.  Constitutional:      General: He is not in acute distress.    Appearance: He is well-developed. He is not diaphoretic.  HENT:     Head: Normocephalic and atraumatic.  Cardiovascular:     Pulses: Normal pulses.  Pulmonary:     Effort: Pulmonary effort is normal.  Musculoskeletal:        General: Swelling, tenderness and signs of injury present. No deformity.     Comments: Fish hook in left 3rd finger PIP dorsal aspect, sensation and ROM intact.   Skin:    General: Skin is warm and dry.     Findings: No erythema or rash.  Neurological:     Mental Status: He is alert and oriented to person, place, and time.     Sensory: No sensory deficit.     Motor: No weakness.  Psychiatric:        Behavior: Behavior normal.    ED Results / Procedures / Treatments   Labs (all labs ordered are listed, but only abnormal results are displayed) Labs Reviewed - No data to display  EKG None  Radiology No  results found.  Procedures .Foreign Body Removal  Date/Time: 03/20/2021 6:53 PM Performed by: Jeannie Fend, PA-C Authorized by: Jeannie Fend, PA-C  Consent: Verbal consent obtained. Consent given by: patient Patient identity confirmed: verbally with patient Body area: skin General location: upper extremity Location details: left long finger Anesthesia: digital block  Anesthesia: Local Anesthetic: lidocaine 1% without epinephrine  Sedation: Patient sedated: no  Patient restrained: no Patient cooperative: yes Complexity: simple 1 objects recovered. Objects recovered: fish hook Post-procedure assessment: foreign body removed Patient tolerance: patient tolerated the procedure well with no immediate complications Comments: Digital block placed unsuccessful, patient removed  foreign body intact.    Medications Ordered in ED Medications  lidocaine (PF) (XYLOCAINE) 1 % injection 5 mL (has no administration in time range)  lidocaine (PF) (XYLOCAINE) 1 % injection (has no administration in time range)  Tdap (BOOSTRIX) injection 0.5 mL (0.5 mLs Intramuscular Given 03/20/21 1915)    ED Course  I have reviewed the triage vital signs and the nursing notes.  Pertinent labs & imaging results that were available during my care of the patient were reviewed by me and considered in my medical decision making (see chart for details).  Clinical Course as of 03/20/21 1918  Mon Mar 20, 2021  [id number] 45 year old male with fishhook to left third finger.  Digital block used to anesthetize his finger at which point patient then removed his own fishhook.  Fishhook was removed intact.  Tetanus was updated.  Patient will be placed on Keflex to prevent infection.  Given return to ER precautions. [LM]    Clinical Course User Index [LM] Alden Hipp   MDM Rules/Calculators/A&P                           Final Clinical Impression(s) / ED Diagnoses Final diagnoses:  Fishhook injury to finger, left, initial encounter    Rx / DC Orders ED Discharge Orders          Ordered    cephALEXin (KEFLEX) 500 MG capsule  3 times daily        03/20/21 1906             Jeannie Fend, PA-C 03/20/21 1918    Linwood Dibbles, MD 03/21/21 [phone]

## 2021-03-20 NOTE — Discharge Instructions (Addendum)
Take antibiotics as prescribed to prevent infection. Recommend recheck with your doctor in 2 days.

## 2021-04-18 ENCOUNTER — Other Ambulatory Visit: Payer: Self-pay

## 2021-04-18 ENCOUNTER — Emergency Department (HOSPITAL_COMMUNITY)
Admission: EM | Admit: 2021-04-18 | Discharge: 2021-04-18 | Disposition: A | Discharge status: Home / Self Care | Payer: Self-pay | Attending: Emergency Medicine | Admitting: Emergency Medicine

## 2021-04-18 ENCOUNTER — Encounter (HOSPITAL_COMMUNITY): Payer: Self-pay

## 2021-04-18 DIAGNOSIS — F1410 Cocaine abuse, uncomplicated: Secondary | ICD-10-CM | POA: Insufficient documentation

## 2021-04-18 DIAGNOSIS — F1910 Other psychoactive substance abuse, uncomplicated: Secondary | ICD-10-CM

## 2021-04-18 DIAGNOSIS — F17210 Nicotine dependence, cigarettes, uncomplicated: Secondary | ICD-10-CM | POA: Insufficient documentation

## 2021-04-18 MED ORDER — HALOPERIDOL 5 MG PO TABS
5.0000 mg | ORAL_TABLET | Freq: Once | ORAL | Status: AC
  Administered 2021-04-18: 5 mg via ORAL
  Filled 2021-04-18: qty 1

## 2021-04-18 MED ORDER — HYDROXYZINE HCL 25 MG PO TABS
25.0000 mg | ORAL_TABLET | Freq: Once | ORAL | Status: AC
  Administered 2021-04-18: 25 mg via ORAL
  Filled 2021-04-18: qty 1

## 2021-04-18 NOTE — ED Triage Notes (Signed)
Pt states that he is having hallucinations.  States that he is being "attacked."  States that he feels like everyone is talking about him.  Reports seeing "shadows and figures."  Denies SI.  Pt is alert and oriented.

## 2021-04-18 NOTE — ED Triage Notes (Signed)
Pt ambulatory to er room number 5, pt states that he is here for cocaine detox.  Pt states that he is married with kids, pt denies si or hi.

## 2021-04-18 NOTE — ED Provider Notes (Signed)
[facility] EMERGENCY DEPARTMENT Provider Note   CSN: [id number] Arrival date & time: 04/18/21  6720     History Chief Complaint  Patient presents with   Addiction Problem    William Navarro is a 45 y.o. male.  HPI Patient presents after cocaine use.  States he smoked crack last night.  States he does not know how much he used.  Has a history of abuse.  States he has been clean however for about 6 months.  Has had episodes of sobriety in the past.  States he drank alcohol last night.  Drinks alcohol often but states he does not drink too heavily most of the time.  States has been dealing with some stress in his life problems with his wife.  No suicidal homicidal thoughts.  States he feels anxious at this time. he would like help with detox.    History reviewed. No pertinent past medical history.  There are no problems to display for this patient.   History reviewed. No pertinent surgical history.     History reviewed. No pertinent family history.  Social History   Tobacco Use   Smoking status: Every Day    Packs/day: 0.50    Years: 4.00    Pack years: 2.00    Types: Cigarettes   Smokeless tobacco: Current  Vaping Use   Vaping Use: Never used  Substance Use Topics   Alcohol use: Yes    Comment: typically on weekends- reports a few beers tonight   Drug use: Yes    Types: Cocaine    Comment: last used couple days ago    Home Medications Prior to Admission medications   Medication Sig Start Date End Date Taking? Authorizing Provider  clindamycin (CLEOCIN) 150 MG capsule Take 2 capsules (300 mg total) by mouth 3 (three) times daily. May dispense as 150mg  capsules 06/06/20   06/08/20, MD  ibuprofen (ADVIL) 600 MG tablet Take 1 tablet (600 mg total) by mouth every 6 (six) hours as needed. 06/06/20   06/08/20, MD    Allergies    Patient has no known allergies.  Review of Systems   Review of Systems  Constitutional:  Negative for appetite change.  HENT:   Negative for congestion.   Respiratory:  Negative for shortness of breath.   Cardiovascular:  Negative for chest pain.  Gastrointestinal:  Negative for abdominal pain.  Genitourinary:  Negative for flank pain.  Musculoskeletal:  Negative for gait problem.  Skin:  Negative for rash.  Neurological:  Negative for weakness.  Psychiatric/Behavioral:  Negative for suicidal ideas. The patient is nervous/anxious.    Physical Exam Updated Vital Signs BP (!) 183/98 (BP Location: Right Arm)   Pulse 97   Temp 99.9 F (37.7 C) (Oral)   Resp 18   Ht 5\' 10"  (1.778 m)   Wt 99.8 kg   SpO2 97%   BMI 31.57 kg/m   Physical Exam Vitals and nursing note reviewed.  HENT:     Head: Atraumatic.  Eyes:     Extraocular Movements: Extraocular movements intact.  Cardiovascular:     Rate and Rhythm: Normal rate.  Chest:     Chest wall: No tenderness.  Abdominal:     Tenderness: There is no abdominal tenderness.  Musculoskeletal:        General: No tenderness.     Cervical back: Neck supple.  Skin:    Capillary Refill: Capillary refill takes less than 2 seconds.  Neurological:  Mental Status: He is alert and oriented to person, place, and time.  Psychiatric:        Mood and Affect: Mood normal.    ED Results / Procedures / Treatments   Labs (all labs ordered are listed, but only abnormal results are displayed) Labs Reviewed - No data to display  EKG None  Radiology No results found.  Procedures Procedures   Medications Ordered in ED Medications  hydrOXYzine (ATARAX/VISTARIL) tablet 25 mg (has no administration in time range)    ED Course  I have reviewed the triage vital signs and the nursing notes.  Pertinent labs & imaging results that were available during my care of the patient were reviewed by me and considered in my medical decision making (see chart for details).    MDM Rules/Calculators/A&P                           Patient presents for cocaine abuse.  Really  only he was last night has been sober off the cocaine for a while before that.  Had been drinking some alcohol but states that is not a problem for him.  Discussed with patient that a more complete sobriety may help him overall since he says that he gets triggered to do cocaine when he is been drinking a lot.  Patient however is only used cocaine last night.  Do not feel as if there is an acute detox needed.  We will give some Atarax here to help with his anxiety.  Given resources for follow-up.  Does not appear suicidal or homicidal.  Discharge home Final Clinical Impression(s) / ED Diagnoses Final diagnoses:  Cocaine use disorder [facility])    Rx / DC Orders ED Discharge Orders     None        Benjiman Core, MD 04/18/21 0825

## 2021-04-18 NOTE — ED Provider Notes (Signed)
[facility] EMERGENCY DEPARTMENT Provider Note   CSN: [id number] Arrival date & time: 04/18/21  0539     History Chief Complaint  Patient presents with   Hallucinations    William Navarro is a 45 y.o. male.  Pt reports using cocaine last pm.  Pt seen here earlier.  Pt reports he has been having hallucinations.  Pt reports he has flashes of someone coming up behind him.  Pt reports he had been drug free for several months.    The history is provided by the patient. No language interpreter was used.      No past medical history on file.  There are no problems to display for this patient.   No past surgical history on file.     No family history on file.  Social History   Tobacco Use   Smoking status: Every Day    Packs/day: 0.50    Years: 4.00    Pack years: 2.00    Types: Cigarettes   Smokeless tobacco: Current  Vaping Use   Vaping Use: Never used  Substance Use Topics   Alcohol use: Yes    Comment: typically on weekends- reports a few beers tonight   Drug use: Yes    Types: Cocaine    Comment: last used couple days ago    Home Medications Prior to Admission medications   Medication Sig Start Date End Date Taking? Authorizing Provider  clindamycin (CLEOCIN) 150 MG capsule Take 2 capsules (300 mg total) by mouth 3 (three) times daily. May dispense as 150mg  capsules 06/06/20   06/08/20, MD  ibuprofen (ADVIL) 600 MG tablet Take 1 tablet (600 mg total) by mouth every 6 (six) hours as needed. 06/06/20   06/08/20, MD    Allergies    Patient has no known allergies.  Review of Systems   Review of Systems  All other systems reviewed and are negative.  Physical Exam Updated Vital Signs BP (!) 162/98   Pulse 74   Temp 98.4 F (36.9 C)   Resp 18   Ht 5\' 10"  (1.778 m)   Wt 99.8 kg   SpO2 99%   BMI 31.57 kg/m   Physical Exam Vitals reviewed.  HENT:     Mouth/Throat:     Mouth: Mucous membranes are moist.  Cardiovascular:     Rate and  Rhythm: Normal rate.  Pulmonary:     Effort: Pulmonary effort is normal.  Abdominal:     General: Abdomen is flat.  Musculoskeletal:        General: Normal range of motion.  Skin:    General: Skin is warm.  Neurological:     General: No focal deficit present.     Mental Status: He is alert.  Psychiatric:        Mood and Affect: Mood normal.    ED Results / Procedures / Treatments   Labs (all labs ordered are listed, but only abnormal results are displayed) Labs Reviewed - No data to display  EKG None  Radiology No results found.  Procedures Procedures   Medications Ordered in ED Medications - No data to display  ED Course  I have reviewed the triage vital signs and the nursing notes.  Pertinent labs & imaging results that were available during my care of the patient were reviewed by me and considered in my medical decision making (see chart for details).    MDM Rules/Calculators/A&P  MDM:  Pt is not suicidal or homicidal  Pt given 5mg  of Haldol.  Pt reports feeling some better.  Pt given referral for out pt treatment facilities.   Final Clinical Impression(s) / ED Diagnoses Final diagnoses:  Substance abuse (HCC)    Rx / DC Orders ED Discharge Orders     None     An After Visit Summary was printed and given to the patient.    04/18/21 1259    06/18/21, MD 04/19/21 1328

## 2021-04-18 NOTE — ED Notes (Signed)
Pt states that he can not tell if the medication is effective. Pt states that he still sees visual strips of light in the room.
